# Patient Record
Sex: Male | Born: 1995
Health system: Southern US, Community
[De-identification: ages and names within clinical notes are randomized; demographics above are authoritative.]

## PROBLEM LIST (undated history)

## (undated) ENCOUNTER — Ambulatory Visit (HOSPITAL_COMMUNITY): Admission: EM | Payer: Medicare Other | Source: Home / Self Care

---
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
## 2017-11-02 ENCOUNTER — Encounter (HOSPITAL_COMMUNITY): Payer: Self-pay | Admitting: Emergency Medicine

## 2017-11-02 ENCOUNTER — Emergency Department (HOSPITAL_COMMUNITY)
Admission: EM | Admit: 2017-11-02 | Discharge: 2017-11-02 | Disposition: A | Discharge status: Home / Self Care | Payer: Medicare Other | Attending: Emergency Medicine | Admitting: Emergency Medicine

## 2017-11-02 DIAGNOSIS — Z202 Contact with and (suspected) exposure to infections with a predominantly sexual mode of transmission: Secondary | ICD-10-CM | POA: Insufficient documentation

## 2017-11-02 LAB — RPR: RPR Ser Ql: NONREACTIVE

## 2017-11-02 MED ORDER — CEFTRIAXONE SODIUM 250 MG IJ SOLR
250.0000 mg | Freq: Once | INTRAMUSCULAR | Status: AC
  Administered 2017-11-02: 250 mg via INTRAMUSCULAR
  Filled 2017-11-02: qty 250

## 2017-11-02 MED ORDER — AZITHROMYCIN 1 G PO PACK
1.0000 g | PACK | Freq: Once | ORAL | Status: AC
  Administered 2017-11-02: 1 g via ORAL
  Filled 2017-11-02: qty 1

## 2017-11-02 MED ORDER — STERILE WATER FOR INJECTION IJ SOLN
INTRAMUSCULAR | Status: AC
  Administered 2017-11-02: 0.9 mL
  Filled 2017-11-02: qty 10

## 2017-11-02 NOTE — ED Provider Notes (Signed)
   Emergency Department Provider Note   I have reviewed the triage vital signs and the nursing notes.   HISTORY  Chief Complaint Exposure to STD   HPI Raymond RibasMatthew D Schwartzman is a 22 y.o. male who was exposed to no known sexually transmitted disease.  He is asymptomatic.  No dysuria, discharge, lymph node swelling.  No nausea vomiting or other associated symptoms. No other associated or modifying symptoms.    History reviewed. No pertinent past medical history.  There are no active problems to display for this patient.   History reviewed. No pertinent surgical history.  Current Outpatient Rx  . Order #: [id number] Class: Print    Allergies Patient has no known allergies.  No family history on file.  Social History Social History   Tobacco Use  . Smoking status: Not on file  Substance Use Topics  . Alcohol use: Not on file  . Drug use: Not on file    Review of Systems  All other systems negative except as documented in the HPI. All pertinent positives and negatives as reviewed in the HPI. ____________________________________________   PHYSICAL EXAM:  VITAL SIGNS: ED Triage Vitals  Enc Vitals Group     BP 11/02/17 0553 (!) 145/100     Pulse Rate 11/02/17 0553 72     Resp 11/02/17 0553 12     Temp 11/02/17 0553 97.8 F (36.6 C)     Temp Source 11/02/17 0553 Oral     SpO2 11/02/17 0553 100 %    Constitutional: Alert and oriented. Well appearing and in no acute distress. Eyes: Conjunctivae are normal. PERRL. EOMI. Head: Atraumatic. Nose: No congestion/rhinnorhea. Mouth/Throat: Mucous membranes are moist.  Oropharynx non-erythematous. Neck: No stridor.  No meningeal signs.   Cardiovascular: Normal rate, regular rhythm. Good peripheral circulation. Grossly normal heart sounds.   GU: No inguinal lymphadenopathy.  No evidence of urethritis.  No obvious discharge.  Testicles symmetric. Respiratory: Normal respiratory effort.  No retractions. Lungs  CTAB. Gastrointestinal: Soft and nontender. No distention.  Musculoskeletal: No lower extremity tenderness nor edema. No gross deformities of extremities. Neurologic:  Normal speech and language. No gross focal neurologic deficits are appreciated.  Skin:  Skin is warm, dry and intact. No rash noted.   ____________________________________________   LABS (all labs ordered are listed, but only abnormal results are displayed)  Labs Reviewed  RPR  HIV ANTIBODY (ROUTINE TESTING)  GC/CHLAMYDIA PROBE AMP ([place]) NOT AT [facility]  ____________________________________________   INITIAL IMPRESSION / ASSESSMENT AND PLAN / ED COURSE  STD prophylaxis provided. No other complaints. Informed him that results will return 24-48 hours and would get a call if positive.    Pertinent labs & imaging results that were available during my care of the patient were reviewed by me and considered in my medical decision making (see chart for details).  ____________________________________________  FINAL CLINICAL IMPRESSION(S) / ED DIAGNOSES  Final diagnoses:  STD exposure     MEDICATIONS GIVEN DURING THIS VISIT:  Medications  cefTRIAXone (ROCEPHIN) injection 250 mg (has no administration in time range)  azithromycin (ZITHROMAX) powder 1 g (has no administration in time range)     NEW OUTPATIENT MEDICATIONS STARTED DURING THIS VISIT:  New Prescriptions   No medications on file    Note:  This note was prepared with assistance of Dragon voice recognition software. Occasional wrong-word or sound-a-like substitutions may have occurred due to the inherent limitations of voice recognition software.   Adrien Shankar, Barbara CowerJason, MD 11/02/17 [phone]

## 2017-11-02 NOTE — ED Notes (Signed)
Pt denies any current symptoms other than mild pain but states his "girlfriend may have gave him a STD".

## 2017-11-02 NOTE — ED Triage Notes (Signed)
Pt reports his girlfriend told him she thought he gave her something but pt states he has never had any stds, now concerned someone may have given him something and wants to be checked, denies any symptoms currently

## 2017-11-02 NOTE — ED Notes (Signed)
Pt ambulatory, stable, and verbalizes understanding of d/c instructions. 

## 2017-11-03 LAB — GC/CHLAMYDIA PROBE AMP (~~LOC~~) NOT AT ARMC
Chlamydia: POSITIVE — AB
Neisseria Gonorrhea: POSITIVE — AB

## 2017-11-03 LAB — HIV ANTIBODY (ROUTINE TESTING W REFLEX): HIV Screen 4th Generation wRfx: NONREACTIVE

## 2018-08-03 ENCOUNTER — Encounter (HOSPITAL_COMMUNITY): Payer: Self-pay | Admitting: Emergency Medicine

## 2018-08-03 ENCOUNTER — Other Ambulatory Visit: Payer: Self-pay

## 2018-08-03 ENCOUNTER — Ambulatory Visit (HOSPITAL_COMMUNITY)
Admission: EM | Admit: 2018-08-03 | Discharge: 2018-08-03 | Disposition: A | Discharge status: Home / Self Care | Payer: Medicare Other | Source: Home / Self Care

## 2018-08-03 ENCOUNTER — Emergency Department (HOSPITAL_COMMUNITY)
Admission: EM | Admit: 2018-08-03 | Discharge: 2018-08-03 | Disposition: A | Discharge status: Home / Self Care | Payer: Medicare Other | Attending: Emergency Medicine | Admitting: Emergency Medicine

## 2018-08-03 DIAGNOSIS — Z202 Contact with and (suspected) exposure to infections with a predominantly sexual mode of transmission: Secondary | ICD-10-CM | POA: Insufficient documentation

## 2018-08-03 DIAGNOSIS — F17210 Nicotine dependence, cigarettes, uncomplicated: Secondary | ICD-10-CM | POA: Diagnosis not present

## 2018-08-03 DIAGNOSIS — R369 Urethral discharge, unspecified: Secondary | ICD-10-CM

## 2018-08-03 LAB — URINALYSIS, ROUTINE W REFLEX MICROSCOPIC
BACTERIA UA: NONE SEEN
Bilirubin Urine: NEGATIVE
Glucose, UA: NEGATIVE mg/dL
HGB URINE DIPSTICK: NEGATIVE
KETONES UR: NEGATIVE mg/dL
NITRITE: NEGATIVE
Protein, ur: NEGATIVE mg/dL
Specific Gravity, Urine: 1.025 (ref 1.005–1.030)
pH: 6 (ref 5.0–8.0)

## 2018-08-03 MED ORDER — STERILE WATER FOR INJECTION IJ SOLN
INTRAMUSCULAR | Status: AC
Start: 2018-08-03 — End: 2018-08-03
  Administered 2018-08-03: 10 mL
  Filled 2018-08-03: qty 10

## 2018-08-03 MED ORDER — AZITHROMYCIN 250 MG PO TABS
1000.0000 mg | ORAL_TABLET | Freq: Once | ORAL | Status: AC
  Administered 2018-08-03: 1000 mg via ORAL
  Filled 2018-08-03: qty 4

## 2018-08-03 MED ORDER — CEFTRIAXONE SODIUM 250 MG IJ SOLR
250.0000 mg | Freq: Once | INTRAMUSCULAR | Status: AC
  Administered 2018-08-03: 250 mg via INTRAMUSCULAR
  Filled 2018-08-03: qty 250

## 2018-08-03 NOTE — Discharge Instructions (Signed)
Please read the instructions below.  Talk with your primary care provider about any new medications.  You have been treated today for gonorrhea and chlamydia. You will receive a call from the hospital if your test results come back positive. Avoid sexual activity until you know your test results. If your results come back positive, it is important that you inform all of your sexual partners.  Follow-up with the health department for any concerns or future testing.

## 2018-08-03 NOTE — ED Provider Notes (Signed)
  Physical Exam  BP (!) 149/98 (BP Location: Right Arm)   Pulse 77   Temp 98.1 F (36.7 C) (Oral)   Resp 16   SpO2 100%   Assumed care from [place] Robinson, PA-C at 3:41 PM. Briefly, the patient is a 23 y.o. male with PMHx of  has no past medical history on file. here with penile discharge. Known STI exposure from recent sexual partner, but he does not know what.   Labs Reviewed  HIV ANTIBODY (ROUTINE TESTING W REFLEX)  RPR  URINALYSIS, ROUTINE W REFLEX MICROSCOPIC  GC/CHLAMYDIA PROBE AMP ([place]) NOT AT [facility]    Course of Care:   Physical Exam Vitals signs and nursing note reviewed.  Constitutional:      General: He is not in acute distress.    Appearance: He is well-developed. He is not diaphoretic.     Comments: Sitting comfortably in bed.  HENT:     Head: Normocephalic and atraumatic.  Eyes:     General:        Right eye: No discharge.        Left eye: No discharge.     Conjunctiva/sclera: Conjunctivae normal.     Comments: EOMs normal to gross examination.  Neck:     Musculoskeletal: Normal range of motion.  Cardiovascular:     Rate and Rhythm: Normal rate and regular rhythm.     Comments: Intact, 2+ radial pulse. Abdominal:     General: There is no distension.  Musculoskeletal: Normal range of motion.  Skin:    General: Skin is warm and dry.  Neurological:     Mental Status: He is alert.     Comments: Cranial nerves intact to gross observation. Patient moves extremities without difficulty.  Psychiatric:        Behavior: Behavior normal.        Thought Content: Thought content normal.        Judgment: Judgment normal.     ED Course/Procedures     Procedures  MDM   Urine showing: small leukocytes.  No trichomonas.  Patient was treated with Rocephin and azithromycin.  Instructed not to have sexual intercourse for 7 days.  Recommended follow-up with health department.    Elisha Ponder, PA-C 08/04/18 0042    Donnetta Hutching, MD 08/04/18  [phone]

## 2018-08-03 NOTE — ED Triage Notes (Signed)
Pt to ER for STD evaluation, reports partner told him she went to her doctor and was told she had an STD, he is not sure what. He states he has white pus noted at the tip of his penis "for a weeks."

## 2018-08-03 NOTE — ED Provider Notes (Signed)
[facility] EMERGENCY DEPARTMENT Provider Note   CSN: [id number] Arrival date & time: 08/03/18  1437    History   Chief Complaint Chief Complaint  Patient presents with  . SEXUALLY TRANSMITTED DISEASE    HPI Raymond Reilly is a 23 y.o. male with past medical history of gonorrhea, chlamydia, presenting to the emergency department with complaint of penile discharge.  Patient states his male sexual partner informed him recently that she tested positive for an unknown STD.  States at that time, he noticed "pus" coming from his penis.  He has no associated symptoms, including no dysuria, penile pain, testicular pain or swelling, abdominal pain, fever, pain with defecation.  No interventions tried for symptoms.  He is sexually active with male partners without protection.     The history is provided by the patient.    History reviewed. No pertinent past medical history.  There are no active problems to display for this patient.   History reviewed. No pertinent surgical history.      Home Medications    Prior to Admission medications   Medication Sig Start Date End Date Taking? Authorizing Provider  Olopatadine HCl (PATADAY) 0.2 % SOLN Apply 1 drop to eye every morning. To both eyes x  5 days qs Patient not taking: Reported on 11/02/2017 12/07/12   Marcellina Millin, MD    Family History No family history on file.  Social History Social History   Tobacco Use  . Smoking status: Current Every Day Smoker    Packs/day: 0.25    Types: Cigarettes  . Smokeless tobacco: Never Used  Substance Use Topics  . Alcohol use: Not on file  . Drug use: Not on file     Allergies   Patient has no known allergies.   Review of Systems Review of Systems  Constitutional: Negative for fever.  Gastrointestinal: Negative for abdominal pain.  Genitourinary: Positive for discharge. Negative for dysuria, frequency, penile pain, scrotal swelling and testicular pain.   Skin: Negative for rash.     Physical Exam Updated Vital Signs BP (!) 149/98 (BP Location: Right Arm)   Pulse 77   Temp 98.1 F (36.7 C) (Oral)   Resp 16   SpO2 100%   Physical Exam Vitals signs and nursing note reviewed. Exam conducted with a chaperone present.  Constitutional:      General: He is not in acute distress.    Appearance: He is well-developed.  HENT:     Head: Normocephalic and atraumatic.  Eyes:     Conjunctiva/sclera: Conjunctivae normal.  Cardiovascular:     Rate and Rhythm: Normal rate.  Pulmonary:     Effort: Pulmonary effort is normal.  Abdominal:     General: Bowel sounds are normal.     Tenderness: There is no abdominal tenderness. There is no guarding.  Genitourinary:    Penis: Circumcised. Discharge (White purulent discharge from the urethral meatus) present. No tenderness.      Scrotum/Testes: Normal.        Right: Tenderness or swelling not present. Right testis is descended.        Left: Tenderness or swelling not present. Left testis is descended.     Epididymis:     Right: Normal.     Left: Normal.     Comments: No rashes or lesions Neurological:     Mental Status: He is alert.  Psychiatric:        Mood and Affect: Mood normal.  Behavior: Behavior normal.      ED Treatments / Results  Labs (all labs ordered are listed, but only abnormal results are displayed) Labs Reviewed  HIV ANTIBODY (ROUTINE TESTING W REFLEX)  RPR  URINALYSIS, ROUTINE W REFLEX MICROSCOPIC  GC/CHLAMYDIA PROBE AMP ([place]) NOT AT [facility]    EKG None  Radiology No results found.  Procedures Procedures (including critical care time)  Medications Ordered in ED Medications  cefTRIAXone (ROCEPHIN) injection 250 mg (250 mg Intramuscular Given 08/03/18 1537)  azithromycin (ZITHROMAX) tablet 1,000 mg (1,000 mg Oral Given 08/03/18 1536)  sterile water (preservative free) injection (10 mLs  Given 08/03/18 1537)     Initial Impression / Assessment and  Plan / ED Course  I have reviewed the triage vital signs and the nursing notes.  Pertinent labs & imaging results that were available during my care of the patient were reviewed by me and considered in my medical decision making (see chart for details).        Patient is entering with exposure to STD, and penile discharge.  Patient is otherwise asymptomatic.  Patient is afebrile without abdominal tenderness, abdominal pain or painful bowel movements to indicate prostatitis.  No tenderness to palpation of the testes or epididymis to suggest orchitis or epididymitis.  STD cultures obtained including HIV, syphilis, gonorrhea and chlamydia. Patient has been treated prophylactically with azithromycin and Rocephin. Discussed importance of using protection when sexually active.   Care assumed at shift change by PA [facility], pending UA results.  Patient to be discharged with instructions to follow up with health department.      Final Clinical Impressions(s) / ED Diagnoses   Final diagnoses:  STD exposure  Penile discharge    ED Discharge Orders    None       Cheick Suhr, [place] N, PA-C 08/03/18 1552    Donnetta Hutching, MD 08/03/18 [phone]

## 2018-08-04 LAB — HIV ANTIBODY (ROUTINE TESTING W REFLEX): HIV Screen 4th Generation wRfx: NONREACTIVE

## 2018-08-05 LAB — GC/CHLAMYDIA PROBE AMP (~~LOC~~) NOT AT ARMC
CHLAMYDIA, DNA PROBE: POSITIVE — AB
NEISSERIA GONORRHEA: NEGATIVE

## 2018-08-05 LAB — RPR: RPR: NONREACTIVE

## 2018-09-11 ENCOUNTER — Telehealth: Payer: Self-pay | Admitting: *Deleted

## 2018-09-11 NOTE — Telephone Encounter (Signed)
Medical Assistant left message on patient's home and cell voicemail. Voicemail states to give a call back to [place] with [facility] at [phone]. Patients appointment will be via telephone.

## 2018-09-12 ENCOUNTER — Ambulatory Visit: Payer: Medicare Other | Attending: Primary Care | Admitting: Primary Care

## 2018-09-12 ENCOUNTER — Other Ambulatory Visit: Payer: Self-pay

## 2019-02-19 ENCOUNTER — Other Ambulatory Visit: Payer: Self-pay

## 2019-02-19 ENCOUNTER — Ambulatory Visit (HOSPITAL_COMMUNITY)
Admission: EM | Admit: 2019-02-19 | Discharge: 2019-02-19 | Disposition: A | Discharge status: Home / Self Care | Payer: Medicare Other | Attending: Emergency Medicine | Admitting: Emergency Medicine

## 2019-02-19 ENCOUNTER — Encounter (HOSPITAL_COMMUNITY): Payer: Self-pay | Admitting: Emergency Medicine

## 2019-02-19 DIAGNOSIS — Z0189 Encounter for other specified special examinations: Secondary | ICD-10-CM | POA: Diagnosis not present

## 2019-02-19 DIAGNOSIS — Z113 Encounter for screening for infections with a predominantly sexual mode of transmission: Secondary | ICD-10-CM | POA: Diagnosis not present

## 2019-02-19 NOTE — ED Triage Notes (Signed)
Pt here for STD screening; denies sx  

## 2019-02-19 NOTE — Discharge Instructions (Addendum)
Your STD tests are pending.  If your test results are positive, we will call you.  You may need treatment and your partner may also need treatment.    Do not have sex until your test results are back.   ° °

## 2019-02-19 NOTE — ED Provider Notes (Signed)
[facility]    CSN: [id number] Arrival date & time: 02/19/19  1547      History   Chief Complaint Chief Complaint  Patient presents with  . Exposure to STD    HPI Raymond Reilly is a 23 y.o. male.   Patient presents with request for STD testing, including HIV and RPR.  He states he is not sexually active and does not have symptoms.  He denies penile discharge, testicular pain, abdominal pain, dysuria, back pain, or other concerns.  The history is provided by the patient.    History reviewed. No pertinent past medical history.  There are no active problems to display for this patient.   History reviewed. No pertinent surgical history.     Home Medications    Prior to Admission medications   Medication Sig Start Date End Date Taking? Authorizing Provider  Olopatadine HCl (PATADAY) 0.2 % SOLN Apply 1 drop to eye every morning. To both eyes x  5 days qs Patient not taking: Reported on 11/02/2017 12/07/12   Isaac Bliss, MD    Family History History reviewed. No pertinent family history.  Social History Social History   Tobacco Use  . Smoking status: Current Every Day Smoker    Packs/day: 0.25    Types: Cigarettes  . Smokeless tobacco: Never Used  Substance Use Topics  . Alcohol use: Not on file  . Drug use: Not on file     Allergies   Patient has no known allergies.   Review of Systems Review of Systems  Constitutional: Negative for chills and fever.  HENT: Negative for ear pain and sore throat.   Eyes: Negative for pain and visual disturbance.  Respiratory: Negative for cough and shortness of breath.   Cardiovascular: Negative for chest pain and palpitations.  Gastrointestinal: Negative for abdominal pain and vomiting.  Genitourinary: Negative for discharge, dysuria, flank pain, hematuria and testicular pain.  Musculoskeletal: Negative for arthralgias and back pain.  Skin: Negative for color change and rash.  Neurological: Negative for  seizures and syncope.  All other systems reviewed and are negative.    Physical Exam Triage Vital Signs ED Triage Vitals  Enc Vitals Group     BP 02/19/19 1607 (!) 120/91     Pulse Rate 02/19/19 1607 71     Resp 02/19/19 1607 18     Temp 02/19/19 1607 98.5 F (36.9 C)     Temp Source 02/19/19 1607 Oral     SpO2 02/19/19 1607 99 %     Weight --      Height --      Head Circumference --      Peak Flow --      Pain Score 02/19/19 1608 0     Pain Loc --      Pain Edu? --      Excl. in [place]? --    No data found.  Updated Vital Signs BP (!) 120/91 (BP Location: Right Arm)   Pulse 71   Temp 98.5 F (36.9 C) (Oral)   Resp 18   SpO2 99%   Visual Acuity Right Eye Distance:   Left Eye Distance:   Bilateral Distance:    Right Eye Near:   Left Eye Near:    Bilateral Near:     Physical Exam Vitals signs and nursing note reviewed.  Constitutional:      Appearance: He is well-developed.  HENT:     Head: Normocephalic and atraumatic.  Eyes:  Conjunctiva/sclera: Conjunctivae normal.  Neck:     Musculoskeletal: Neck supple.  Cardiovascular:     Rate and Rhythm: Normal rate and regular rhythm.     Heart sounds: No murmur.  Pulmonary:     Effort: Pulmonary effort is normal. No respiratory distress.     Breath sounds: Normal breath sounds.  Abdominal:     General: Bowel sounds are normal.     Palpations: Abdomen is soft.     Tenderness: There is no abdominal tenderness. There is no right CVA tenderness, left CVA tenderness, guarding or rebound.  Genitourinary:    Penis: Normal.      Scrotum/Testes: Normal.  Skin:    General: Skin is warm and dry.  Neurological:     Mental Status: He is alert.      UC Treatments / Results  Labs (all labs ordered are listed, but only abnormal results are displayed) Labs Reviewed  HIV ANTIBODY (ROUTINE TESTING W REFLEX)  RPR  CYTOLOGY, (ORAL, ANAL, URETHRAL) ANCILLARY ONLY    EKG   Radiology No results found.   Procedures Procedures (including critical care time)  Medications Ordered in UC Medications - No data to display  Initial Impression / Assessment and Plan / UC Course  I have reviewed the triage vital signs and the nursing notes.  Pertinent labs & imaging results that were available during my care of the patient were reviewed by me and considered in my medical decision making (see chart for details).    Patient request for STD testing.  Urethral swab obtained.  HIV and RPR pending.  No treatment given today as patient is asymptomatic and denies sexual activity.  Discussed that he should refrain from sex until the STD tests are back.  Discussed that he may need treatment if his test results are positive.  Patient agrees to plan of care.     Final Clinical Impressions(s) / UC Diagnoses   Final diagnoses:  Patient request for diagnostic testing     Discharge Instructions     Your STD tests are pending.  If your test results are positive, we will call you.  You may need treatment and your partner may also need treatment.      Do not have sex until your test results are back.       ED Prescriptions    None     PDMP not reviewed this encounter.   Mickie Bail, NP 02/19/19 1630

## 2019-02-20 LAB — HIV ANTIBODY (ROUTINE TESTING W REFLEX): HIV Screen 4th Generation wRfx: NONREACTIVE

## 2019-02-20 LAB — RPR: RPR Ser Ql: NONREACTIVE

## 2019-02-21 LAB — CYTOLOGY, (ORAL, ANAL, URETHRAL) ANCILLARY ONLY
Chlamydia: NEGATIVE
Neisseria Gonorrhea: NEGATIVE
Trichomonas: POSITIVE — AB

## 2019-02-22 ENCOUNTER — Telehealth (HOSPITAL_COMMUNITY): Payer: Self-pay | Admitting: Emergency Medicine

## 2019-02-22 MED ORDER — METRONIDAZOLE 500 MG PO TABS: 2000.0000 mg | ORAL_TABLET | Freq: Once | ORAL | 0 refills | Status: AC

## 2019-02-22 NOTE — Telephone Encounter (Signed)
Trichomonas is positive. Rx  for Flagyl 2 grams, once was sent to the pharmacy of record. Pt needs education to refrain from sexual intercourse for 7 days to give the medicine time to work. Sexual partners need to be notified and tested/treated. Condoms may reduce risk of reinfection. Recheck for further evaluation if symptoms are not improving.   Attempted to reach patient. Mother answered and stated she would have him call me back.

## 2019-02-23 ENCOUNTER — Telehealth (HOSPITAL_COMMUNITY): Payer: Self-pay | Admitting: Emergency Medicine

## 2019-02-23 MED ORDER — METRONIDAZOLE 500 MG PO TABS: 2000.0000 mg | ORAL_TABLET | Freq: Once | ORAL | 0 refills | Status: AC

## 2019-02-23 NOTE — Telephone Encounter (Signed)
Called again, mother states to call at 3pm and he will be off work.

## 2019-02-23 NOTE — Telephone Encounter (Signed)
Patient contacted and made aware of trich   results, all questions answered  Pharmacy change

## 2019-03-17 ENCOUNTER — Encounter (HOSPITAL_COMMUNITY): Payer: Self-pay | Admitting: Emergency Medicine

## 2019-03-17 ENCOUNTER — Emergency Department (HOSPITAL_COMMUNITY)
Admission: EM | Admit: 2019-03-17 | Discharge: 2019-03-17 | Disposition: A | Discharge status: Home / Self Care | Payer: Medicare Other | Attending: Emergency Medicine | Admitting: Emergency Medicine

## 2019-03-17 ENCOUNTER — Emergency Department (HOSPITAL_COMMUNITY): Payer: Medicare Other

## 2019-03-17 DIAGNOSIS — Y9389 Activity, other specified: Secondary | ICD-10-CM | POA: Insufficient documentation

## 2019-03-17 DIAGNOSIS — S6991XA Unspecified injury of right wrist, hand and finger(s), initial encounter: Secondary | ICD-10-CM

## 2019-03-17 DIAGNOSIS — W25XXXA Contact with sharp glass, initial encounter: Secondary | ICD-10-CM | POA: Diagnosis not present

## 2019-03-17 DIAGNOSIS — F17210 Nicotine dependence, cigarettes, uncomplicated: Secondary | ICD-10-CM | POA: Diagnosis not present

## 2019-03-17 DIAGNOSIS — Y92003 Bedroom of unspecified non-institutional (private) residence as the place of occurrence of the external cause: Secondary | ICD-10-CM | POA: Diagnosis not present

## 2019-03-17 DIAGNOSIS — Y999 Unspecified external cause status: Secondary | ICD-10-CM | POA: Diagnosis not present

## 2019-03-17 DIAGNOSIS — S61421A Laceration with foreign body of right hand, initial encounter: Secondary | ICD-10-CM | POA: Insufficient documentation

## 2019-03-17 MED ORDER — HYDROCODONE-ACETAMINOPHEN 5-325 MG PO TABS
1.0000 | ORAL_TABLET | Freq: Once | ORAL | Status: DC
  Filled 2019-03-17: qty 1

## 2019-03-17 MED ORDER — IBUPROFEN 600 MG PO TABS: 600.0000 mg | ORAL_TABLET | Freq: Four times a day (QID) | ORAL | 0 refills | Status: DC | PRN

## 2019-03-17 MED ORDER — ACETAMINOPHEN 500 MG PO TABS: 500.0000 mg | ORAL_TABLET | Freq: Four times a day (QID) | ORAL | 0 refills | Status: DC | PRN

## 2019-03-17 MED ORDER — CEPHALEXIN 500 MG PO CAPS
500.0000 mg | ORAL_CAPSULE | Freq: Three times a day (TID) | ORAL | 0 refills | Status: AC
Start: 2019-03-17 — End: 2019-03-22

## 2019-03-17 NOTE — Discharge Instructions (Signed)
Keep the area clean and dry.  The Steri-Strips will fall off within about 4 to 5 days.  Apply antibiotic ointment after the Steri-Strips fall off but not before.  Please take all of your antibiotics until finished!   You may develop abdominal discomfort or diarrhea from the antibiotic.  You may help offset this with probiotics which you can buy or get in yogurt. Do not eat  or take the probiotics until 2 hours after your antibiotic.   You can take 1 to 2 tablets of Tylenol (350mg -1000mg  depending on the dose) every 6 hours as needed for pain.  Do not exceed 4000 mg of Tylenol daily.  If your pain persists you can take a doses of ibuprofen in between doses of Tylenol.  I usually recommend 400 to 600 mg of ibuprofen every 6 hours.  Take this with food to avoid upset stomach issues.  You can also apply ice and elevate the hand above the level of the heart to help reduce swelling and pain.  Return to the emergency department immediately if any concerning signs or symptoms develop such as fevers, severe swelling, redness of the skin or streaking of redness up the arm, or abnormal drainage.  Follow-up with primary care provider for wound recheck if you have any ongoing issues.
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
## 2019-03-17 NOTE — ED Notes (Signed)
Patient transported to X-ray 
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
## 2019-03-17 NOTE — ED Notes (Signed)
Pt. Refused his pain medication

## 2020-04-26 ENCOUNTER — Other Ambulatory Visit: Payer: Self-pay

## 2020-04-26 ENCOUNTER — Encounter (HOSPITAL_COMMUNITY): Payer: Self-pay | Admitting: Emergency Medicine

## 2020-04-26 ENCOUNTER — Emergency Department (HOSPITAL_COMMUNITY): Payer: Medicare Other

## 2020-04-26 ENCOUNTER — Emergency Department (HOSPITAL_COMMUNITY)
Admission: EM | Admit: 2020-04-26 | Discharge: 2020-04-26 | Disposition: A | Discharge status: Home / Self Care | Payer: Medicare Other | Attending: Emergency Medicine | Admitting: Emergency Medicine

## 2020-04-26 DIAGNOSIS — Z5321 Procedure and treatment not carried out due to patient leaving prior to being seen by health care provider: Secondary | ICD-10-CM | POA: Diagnosis not present

## 2020-04-26 DIAGNOSIS — S0083XA Contusion of other part of head, initial encounter: Secondary | ICD-10-CM | POA: Diagnosis not present

## 2020-04-26 LAB — CBC WITH DIFFERENTIAL/PLATELET
Abs Immature Granulocytes: 0.18 10*3/uL — ABNORMAL HIGH (ref 0.00–0.07)
Basophils Absolute: 0.1 10*3/uL (ref 0.0–0.1)
Basophils Relative: 0 %
Eosinophils Absolute: 0 10*3/uL (ref 0.0–0.5)
Eosinophils Relative: 0 %
HCT: 45.9 % (ref 39.0–52.0)
Hemoglobin: 15.3 g/dL (ref 13.0–17.0)
Immature Granulocytes: 1 %
Lymphocytes Relative: 5 %
Lymphs Abs: 0.9 10*3/uL (ref 0.7–4.0)
MCH: 30.2 pg (ref 26.0–34.0)
MCHC: 33.3 g/dL (ref 30.0–36.0)
MCV: 90.7 fL (ref 80.0–100.0)
Monocytes Absolute: 1 10*3/uL (ref 0.1–1.0)
Monocytes Relative: 5 %
Neutro Abs: 18.2 10*3/uL — ABNORMAL HIGH (ref 1.7–7.7)
Neutrophils Relative %: 89 %
Platelets: 202 10*3/uL (ref 150–400)
RBC: 5.06 MIL/uL (ref 4.22–5.81)
RDW: 12.5 % (ref 11.5–15.5)
WBC: 20.3 10*3/uL — ABNORMAL HIGH (ref 4.0–10.5)
nRBC: 0 % (ref 0.0–0.2)

## 2020-04-26 LAB — BASIC METABOLIC PANEL
Anion gap: 13 (ref 5–15)
BUN: 12 mg/dL (ref 6–20)
CO2: 24 mmol/L (ref 22–32)
Calcium: 9.4 mg/dL (ref 8.9–10.3)
Chloride: 103 mmol/L (ref 98–111)
Creatinine, Ser: 1.24 mg/dL (ref 0.61–1.24)
GFR, Estimated: >60 mL/min (ref >60)
Glucose, Bld: 109 mg/dL — ABNORMAL HIGH (ref 70–99)
Potassium: 3.6 mmol/L (ref 3.5–5.1)
Sodium: 140 mmol/L (ref 135–145)

## 2020-04-26 MED ORDER — OXYCODONE-ACETAMINOPHEN 5-325 MG PO TABS
1.0000 | ORAL_TABLET | Freq: Once | ORAL | Status: AC
  Administered 2020-04-26: 1 via ORAL
  Filled 2020-04-26: qty 1
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
## 2020-06-30 ENCOUNTER — Encounter (HOSPITAL_COMMUNITY): Payer: Self-pay | Admitting: *Deleted

## 2020-06-30 ENCOUNTER — Other Ambulatory Visit: Payer: Self-pay

## 2020-06-30 ENCOUNTER — Ambulatory Visit (HOSPITAL_COMMUNITY)
Admission: EM | Admit: 2020-06-30 | Discharge: 2020-06-30 | Disposition: A | Discharge status: Home / Self Care | Payer: Medicare Other | Attending: Urgent Care | Admitting: Urgent Care

## 2020-06-30 DIAGNOSIS — R369 Urethral discharge, unspecified: Secondary | ICD-10-CM

## 2020-06-30 NOTE — ED Triage Notes (Signed)
Pt reports STD with penial  Discharge.

## 2020-06-30 NOTE — ED Provider Notes (Signed)
  [facility]   MRN: [id number] DOB: [birth year]  Subjective:   Raymond Reilly is a 25 y.o. male presenting for 3-day history of penile discharge.  Patient states that he wants to make sure he gets checked for sexually transmitted infections.  He has multiple male partners and tries to use condoms consistently but is not as consistent when he receives oral sex. Denies dysuria, hematuria, urinary frequency, penile swelling, testicular pain, testicular swelling, anal pain, groin pain.  No exposures to his knowledge.   No current facility-administered medications for this encounter.  Current Outpatient Medications:  .  acetaminophen (TYLENOL) 500 MG tablet, Take 1 tablet (500 mg total) by mouth every 6 (six) hours as needed., Disp: 30 tablet, Rfl: 0 .  ibuprofen (ADVIL) 600 MG tablet, Take 1 tablet (600 mg total) by mouth every 6 (six) hours as needed., Disp: 30 tablet, Rfl: 0   No Known Allergies  History reviewed. No pertinent past medical history.   History reviewed. No pertinent surgical history.  History reviewed. No pertinent family history.  Social History   Tobacco Use  . Smoking status: Current Every Day Smoker    Packs/day: 0.25    Types: Cigarettes  . Smokeless tobacco: Never Used  Substance Use Topics  . Alcohol use: Yes  . Drug use: Never    ROS   Objective:   Vitals: BP 138/87 (BP Location: Right Arm)   Pulse 63   Temp 98.3 F (36.8 C) (Oral)   Resp 16   SpO2 100%   Physical Exam Constitutional:      General: He is not in acute distress.    Appearance: Normal appearance. He is well-developed and normal weight. He is not ill-appearing, toxic-appearing or diaphoretic.  HENT:     Head: Normocephalic and atraumatic.     Right Ear: External ear normal.     Left Ear: External ear normal.     Nose: Nose normal.     Mouth/Throat:     Pharynx: Oropharynx is clear.  Eyes:     General: No scleral icterus.       Right eye: No  discharge.        Left eye: No discharge.     Extraocular Movements: Extraocular movements intact.     Pupils: Pupils are equal, round, and reactive to light.  Cardiovascular:     Rate and Rhythm: Normal rate.  Pulmonary:     Effort: Pulmonary effort is normal.  Genitourinary:    Penis: Circumcised. No phimosis, paraphimosis, hypospadias, erythema, tenderness, discharge, swelling or lesions.   Musculoskeletal:     Cervical back: Normal range of motion.  Neurological:     Mental Status: He is alert and oriented to person, place, and time.  Psychiatric:        Mood and Affect: Mood normal.        Behavior: Behavior normal.        Thought Content: Thought content normal.        Judgment: Judgment normal.      Assessment and Plan :   PDMP not reviewed this encounter.  1. Penile discharge     Recommended he continue safe sex practices.  Hold off on having any sex until we see lab results.  We will treat as appropriate.   Wallis Bamberg, PA-C 06/30/20 1125

## 2020-07-01 ENCOUNTER — Telehealth (HOSPITAL_COMMUNITY): Payer: Self-pay | Admitting: Emergency Medicine

## 2020-07-01 LAB — CYTOLOGY, (ORAL, ANAL, URETHRAL) ANCILLARY ONLY
Chlamydia: POSITIVE — AB
Comment: NEGATIVE
Comment: NEGATIVE
Comment: NORMAL
Neisseria Gonorrhea: NEGATIVE
Trichomonas: NEGATIVE

## 2020-07-01 MED ORDER — DOXYCYCLINE HYCLATE 100 MG PO CAPS
100.0000 mg | ORAL_CAPSULE | Freq: Two times a day (BID) | ORAL | 0 refills | Status: AC
Start: 2020-07-01 — End: 2020-07-08

## 2020-07-02 ENCOUNTER — Telehealth (HOSPITAL_COMMUNITY): Payer: Self-pay | Admitting: Emergency Medicine

## 2020-07-02 NOTE — Telephone Encounter (Signed)
Patient called and states the pharmacy did not receive his prescription.  I note the receipt is confirmed in the computer, so I called pharmacy to follow up.  Pharmacy states prescription was picked up yesterday.   Patient made aware he will need to contact the pharmacy for discrepancies.

## 2020-08-17 ENCOUNTER — Encounter (HOSPITAL_COMMUNITY): Payer: Self-pay

## 2020-08-17 ENCOUNTER — Other Ambulatory Visit: Payer: Self-pay

## 2020-08-17 ENCOUNTER — Ambulatory Visit (HOSPITAL_COMMUNITY)
Admission: EM | Admit: 2020-08-17 | Discharge: 2020-08-17 | Disposition: A | Discharge status: Home / Self Care | Payer: Medicare Other | Attending: Emergency Medicine | Admitting: Emergency Medicine

## 2020-08-17 DIAGNOSIS — R369 Urethral discharge, unspecified: Secondary | ICD-10-CM | POA: Insufficient documentation

## 2020-08-17 DIAGNOSIS — Z113 Encounter for screening for infections with a predominantly sexual mode of transmission: Secondary | ICD-10-CM | POA: Diagnosis not present

## 2020-08-17 NOTE — Discharge Instructions (Addendum)
We will contact you with the results from your lab work and any additional treatment.    Do not have sex while taking undergoing treatment for STI.  Make sure that all of your partners get tested and treated.   Use a condom or other barrier method for all sexual encounters.    Return or go to the Emergency Department if symptoms worsen or do not improve in the next few days.   

## 2020-08-17 NOTE — ED Triage Notes (Signed)
Pt present penile discharge, pt states yesterday he was drain pus.

## 2020-08-17 NOTE — ED Provider Notes (Signed)
[facility]    CSN: [id number] Arrival date & time: 08/17/20  1040      History   Chief Complaint Chief Complaint  Patient presents with  . Exposure to STD    HPI Raymond Reilly is a 25 y.o. male.   Patient here for evaluation of penile discharge that started yesterday.  Denies any known exposures STIs but patient is sexually active.  Denies any pain or swelling to the penis or scrotum.  Denies any dysuria, urgency, or frequency.  Has not tried any OTC medications or treatments. Denies any specific alleviating or aggravating factors.  Denies any fevers, chest pain, shortness of breath, N/V/D, numbness, tingling, weakness, abdominal pain, or headaches.   ROS: As per HPI, all other pertinent ROS negative   The history is provided by the patient.  Exposure to STD    History reviewed. No pertinent past medical history.  There are no problems to display for this patient.   History reviewed. No pertinent surgical history.     Home Medications    Prior to Admission medications   Medication Sig Start Date End Date Taking? Authorizing Provider  acetaminophen (TYLENOL) 500 MG tablet Take 1 tablet (500 mg total) by mouth every 6 (six) hours as needed. 03/17/19   Fawze, Mina A, PA-C  ibuprofen (ADVIL) 600 MG tablet Take 1 tablet (600 mg total) by mouth every 6 (six) hours as needed. 03/17/19   Jeanie Sewer, PA-C    Family History History reviewed. No pertinent family history.  Social History Social History   Tobacco Use  . Smoking status: Current Every Day Smoker    Packs/day: 0.25    Types: Cigarettes  . Smokeless tobacco: Never Used  Substance Use Topics  . Alcohol use: Yes  . Drug use: Never     Allergies   Patient has no known allergies.   Review of Systems Review of Systems  Genitourinary: Positive for penile discharge.  All other systems reviewed and are negative.    Physical Exam Triage Vital Signs ED Triage Vitals  Enc Vitals Group      BP 08/17/20 1119 131/84     Pulse Rate 08/17/20 1119 62     Resp 08/17/20 1119 18     Temp 08/17/20 1119 98.9 F (37.2 C)     Temp Source 08/17/20 1119 Oral     SpO2 08/17/20 1119 100 %     Weight --      Height --      Head Circumference --      Peak Flow --      Pain Score 08/17/20 1118 1     Pain Loc --      Pain Edu? --      Excl. in GC? --    No data found.  Updated Vital Signs BP 131/84 (BP Location: Left Arm)   Pulse 62   Temp 98.9 F (37.2 C) (Oral)   Resp 18   SpO2 100%   Visual Acuity Right Eye Distance:   Left Eye Distance:   Bilateral Distance:    Right Eye Near:   Left Eye Near:    Bilateral Near:     Physical Exam Vitals and nursing note reviewed.  Constitutional:      General: He is not in acute distress.    Appearance: Normal appearance. He is not ill-appearing, toxic-appearing or diaphoretic.  HENT:     Head: Normocephalic and atraumatic.  Eyes:     Conjunctiva/sclera:  Conjunctivae normal.  Cardiovascular:     Rate and Rhythm: Normal rate.     Pulses: Normal pulses.  Pulmonary:     Effort: Pulmonary effort is normal.  Abdominal:     General: Abdomen is flat.  Genitourinary:    Comments: declines Musculoskeletal:        General: Normal range of motion.     Cervical back: Normal range of motion.  Skin:    General: Skin is warm and dry.  Neurological:     General: No focal deficit present.     Mental Status: He is alert and oriented to person, place, and time.  Psychiatric:        Mood and Affect: Mood normal.      UC Treatments / Results  Labs (all labs ordered are listed, but only abnormal results are displayed) Labs Reviewed  CYTOLOGY, (ORAL, ANAL, URETHRAL) ANCILLARY ONLY    EKG   Radiology No results found.  Procedures Procedures (including critical care time)  Medications Ordered in UC Medications - No data to display  Initial Impression / Assessment and Plan / UC Course  I have reviewed the triage vital  signs and the nursing notes.  Pertinent labs & imaging results that were available during my care of the patient were reviewed by me and considered in my medical decision making (see chart for details).     Penile discharge, STI screening Self swab obtained.  Will treat based on results. Safe sex practices discussed including condoms or other barrier methods. Follow-up for any with worsening symptoms  Final Clinical Impressions(s) / UC Diagnoses   Final diagnoses:  Penile discharge  Screen for STD (sexually transmitted disease)     Discharge Instructions     We will contact you with the results from your lab work and any additional treatment.    Do not have sex while taking undergoing treatment for STI.  Make sure that all of your partners get tested and treated.   Use a condom or other barrier method for all sexual encounters.    Return or go to the Emergency Department if symptoms worsen or do not improve in the next few days.      ED Prescriptions    None     PDMP not reviewed this encounter.   Ivette Loyal, NP 08/17/20 1209

## 2020-08-18 ENCOUNTER — Telehealth (HOSPITAL_COMMUNITY): Payer: Self-pay | Admitting: Emergency Medicine

## 2020-08-18 LAB — CYTOLOGY, (ORAL, ANAL, URETHRAL) ANCILLARY ONLY
Chlamydia: NEGATIVE
Comment: NEGATIVE
Comment: NEGATIVE
Comment: NORMAL
Neisseria Gonorrhea: NEGATIVE
Trichomonas: POSITIVE — AB

## 2020-08-18 MED ORDER — METRONIDAZOLE 500 MG PO TABS: 500.0000 mg | ORAL_TABLET | Freq: Two times a day (BID) | ORAL | 0 refills | Status: DC

## 2020-08-18 NOTE — Telephone Encounter (Signed)
Patient wanted prescription resent to different pharmacy

## 2020-09-10 ENCOUNTER — Ambulatory Visit (HOSPITAL_COMMUNITY)
Admission: EM | Admit: 2020-09-10 | Discharge: 2020-09-10 | Disposition: A | Discharge status: Home / Self Care | Payer: Medicare Other | Attending: Medical Oncology | Admitting: Medical Oncology

## 2020-09-10 ENCOUNTER — Encounter (HOSPITAL_COMMUNITY): Payer: Self-pay

## 2020-09-10 DIAGNOSIS — R369 Urethral discharge, unspecified: Secondary | ICD-10-CM | POA: Diagnosis present

## 2020-09-10 LAB — HIV ANTIBODY (ROUTINE TESTING W REFLEX): HIV Screen 4th Generation wRfx: NONREACTIVE

## 2020-09-10 NOTE — ED Triage Notes (Signed)
Pt requested STD's testing.

## 2020-09-10 NOTE — ED Provider Notes (Signed)
[facility]    CSN: [id number] Arrival date & time: 09/10/20  8032      History   Chief Complaint STI screening  HPI Raymond Reilly is a 24 y.o. male.   HPI   STI screening: Pt presents today for STI screening as he has had penile discharge. He has a history of trichomonas, chlmaydia and gonorrhea with last result being on 08/17/2020 with trichomonas which was treated with flagyl. Pt reports that his symptoms resolved but have returned. Has had potential re-exposure. NO fevers, abdominal pain, testicular pain, penile lesions, dysuria.  History reviewed. No pertinent past medical history.  There are no problems to display for this patient.   History reviewed. No pertinent surgical history.   Home Medications    Prior to Admission medications   Medication Sig Start Date End Date Taking? Authorizing Provider  acetaminophen (TYLENOL) 500 MG tablet Take 1 tablet (500 mg total) by mouth every 6 (six) hours as needed. 03/17/19   Fawze, Mina A, PA-C  ibuprofen (ADVIL) 600 MG tablet Take 1 tablet (600 mg total) by mouth every 6 (six) hours as needed. 03/17/19   Fawze, Mina A, PA-C  metroNIDAZOLE (FLAGYL) 500 MG tablet Take 1 tablet (500 mg total) by mouth 2 (two) times daily. 08/18/20   Lamptey, Britta Mccreedy, MD    Family History History reviewed. No pertinent family history.  Social History Social History   Tobacco Use  . Smoking status: Current Every Day Smoker    Packs/day: 0.25    Types: Cigarettes  . Smokeless tobacco: Never Used  Substance Use Topics  . Alcohol use: Yes  . Drug use: Never     Allergies   Patient has no known allergies.   Review of Systems Review of Systems  As stated above in HPI Physical Exam Triage Vital Signs ED Triage Vitals  Enc Vitals Group     BP 09/10/20 1047 (!) 145/87     Pulse Rate 09/10/20 1047 60     Resp 09/10/20 1047 16     Temp 09/10/20 1047 98.4 F (36.9 C)     Temp Source 09/10/20 1047 Oral     SpO2  09/10/20 1047 98 %     Weight --      Height --      Head Circumference --      Peak Flow --      Pain Score 09/10/20 1046 0     Pain Loc --      Pain Edu? --      Excl. in [place]? --    No data found.  Updated Vital Signs BP (!) 145/87 (BP Location: Right Arm)   Pulse 60   Temp 98.4 F (36.9 C) (Oral)   Resp 16   SpO2 98%   Physical Exam Vitals and nursing note reviewed.  Constitutional:      General: He is not in acute distress.    Appearance: Normal appearance. He is not ill-appearing, toxic-appearing or diaphoretic.  Cardiovascular:     Rate and Rhythm: Normal rate and regular rhythm.     Pulses: Normal pulses.     Heart sounds: Normal heart sounds.  Pulmonary:     Effort: Pulmonary effort is normal.     Breath sounds: Normal breath sounds.  Abdominal:     General: Bowel sounds are normal. There is no distension.     Palpations: Abdomen is soft. There is no mass.     Tenderness: There is no  abdominal tenderness. There is no right CVA tenderness, left CVA tenderness, guarding or rebound.     Hernia: No hernia is present.  Genitourinary:    Comments: Pt performs self swab collection in privacy  Lymphadenopathy:     Cervical: No cervical adenopathy.  Skin:    General: Skin is warm.     Coloration: Skin is not jaundiced.  Neurological:     Mental Status: He is alert.      UC Treatments / Results  Labs (all labs ordered are listed, but only abnormal results are displayed) Labs Reviewed - No data to display  EKG   Radiology No results found.  Procedures Procedures (including critical care time)  Medications Ordered in UC Medications - No data to display  Initial Impression / Assessment and Plan / UC Course  I have reviewed the triage vital signs and the nursing notes.  Pertinent labs & imaging results that were available during my care of the patient were reviewed by me and considered in my medical decision making (see chart for details).     New.   We will rescreen and treat as appropriate.  Patient wants to discuss long-term complications of STIs which we did- he is concerned that he may become "sterile" from repeat STIs.  He also wishes to see a fertility specialist to ensure that his repeated STIs will not affect his ability to have children.  Final Clinical Impressions(s) / UC Diagnoses   Final diagnoses:  Penile discharge   Discharge Instructions   None    ED Prescriptions    None     PDMP not reviewed this encounter.   Rushie Chestnut, [place] 09/10/20 1145

## 2020-09-11 LAB — CYTOLOGY, (ORAL, ANAL, URETHRAL) ANCILLARY ONLY
Chlamydia: POSITIVE — AB
Comment: NEGATIVE
Comment: NEGATIVE
Comment: NORMAL
Neisseria Gonorrhea: NEGATIVE
Trichomonas: POSITIVE — AB

## 2020-09-11 LAB — RPR: RPR Ser Ql: NONREACTIVE

## 2020-09-12 ENCOUNTER — Telehealth: Payer: Self-pay

## 2020-09-12 ENCOUNTER — Telehealth (HOSPITAL_COMMUNITY): Payer: Self-pay | Admitting: Emergency Medicine

## 2020-09-12 MED ORDER — METRONIDAZOLE 500 MG PO TABS: 2000.0000 mg | ORAL_TABLET | Freq: Once | ORAL | 0 refills | Status: AC

## 2020-09-12 MED ORDER — AZITHROMYCIN 250 MG PO TABS: 1000.0000 mg | ORAL_TABLET | Freq: Once | ORAL | 0 refills | Status: AC

## 2020-09-12 NOTE — Telephone Encounter (Signed)
Pt contacted office in regards to RXs sent to pharmacy today. Tried contacting pt back, no answer, no voicemail.

## 2020-09-25 NOTE — Telephone Encounter (Signed)
Patient returned call after receiving letter.  States he picked up and took the medicines prescribed but is still having discharge when he squeezes his penis.  Encouraged him to return for a visit.  Patient verbalized understanding and appointment scheduled for Monday.

## 2020-09-29 ENCOUNTER — Ambulatory Visit (HOSPITAL_COMMUNITY): Payer: Medicare Other

## 2021-12-18 ENCOUNTER — Other Ambulatory Visit: Payer: Self-pay | Admitting: *Deleted

## 2021-12-18 NOTE — Patient Outreach (Signed)
  Care Coordination   12/18/2021 Name: Raymond Reilly MRN: [id number] DOB: 1995/11/18   Care Coordination Outreach Attempts:  An unsuccessful telephone outreach was attempted today to offer the patient information about available care coordination services as a benefit of their health plan.   Follow Up Plan:  No further outreach attempts will be made at this time. We have been unable to contact the patient to offer or enroll patient in care coordination services. Phone is no longer in service. Mother's phone does not accept calls. No info in Epic after 08/2020. No records in [place] or [place].  Encounter Outcome:  No Answer  Care Coordination Interventions Activated:  No   Care Coordination Interventions:  No, not indicated    Facundo Allemand C. Burgess Estelle, MSN, [facility] Gerontological Nurse Practitioner [facility] Care Management [phone]

## 2022-01-21 ENCOUNTER — Ambulatory Visit (HOSPITAL_COMMUNITY): Payer: Medicare Other

## 2022-01-24 ENCOUNTER — Ambulatory Visit (HOSPITAL_COMMUNITY): Payer: Medicare Other
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
## 2022-02-13 ENCOUNTER — Ambulatory Visit (HOSPITAL_COMMUNITY)
Admission: EM | Admit: 2022-02-13 | Discharge: 2022-02-13 | Disposition: A | Discharge status: Home / Self Care | Payer: Medicare Other | Attending: Physician Assistant | Admitting: Physician Assistant

## 2022-02-13 ENCOUNTER — Encounter (HOSPITAL_COMMUNITY): Payer: Self-pay

## 2022-02-13 DIAGNOSIS — Z202 Contact with and (suspected) exposure to infections with a predominantly sexual mode of transmission: Secondary | ICD-10-CM | POA: Diagnosis present

## 2022-02-13 LAB — RAPID HIV SCREEN (HIV 1/2 AB+AG)
HIV 1/2 Antibodies: NONREACTIVE
HIV-1 P24 Antigen - HIV24: NONREACTIVE

## 2022-02-13 LAB — HIV ANTIBODY (ROUTINE TESTING W REFLEX): HIV Screen 4th Generation wRfx: NONREACTIVE

## 2022-02-13 NOTE — ED Triage Notes (Signed)
Patient with c/o itching in genital area. States he wants to be tested for all STIs.

## 2022-02-13 NOTE — ED Provider Notes (Signed)
[facility]    CSN: [id number] Arrival date & time: 02/13/22  1102      History   Chief Complaint Chief Complaint  Patient presents with   Exposure to STD    HPI Raymond Reilly is a 26 y.o. male.   Pt is requesting std testing.  Pt worried he has had an exposure.  Pt denies any current symptoms   The history is provided by the patient. No language interpreter was used.  Exposure to STD This is a new problem. The problem occurs constantly. Pertinent negatives include no abdominal pain. Nothing aggravates the symptoms. Nothing relieves the symptoms. He has tried nothing for the symptoms. The treatment provided no relief.    History reviewed. No pertinent past medical history.  There are no problems to display for this patient.   History reviewed. No pertinent surgical history.     Home Medications    Prior to Admission medications   Not on File    Family History No family history on file.  Social History Social History   Tobacco Use   Smoking status: Every Day    Packs/day: 0.25    Types: Cigarettes   Smokeless tobacco: Never  Substance Use Topics   Alcohol use: Yes   Drug use: Never     Allergies   Patient has no known allergies.   Review of Systems Review of Systems  Gastrointestinal:  Negative for abdominal pain.  All other systems reviewed and are negative.    Physical Exam Triage Vital Signs ED Triage Vitals [02/13/22 1218]  Enc Vitals Group     BP 125/83     Pulse Rate (!) 59     Resp 20     Temp 98.3 F (36.8 C)     Temp Source Oral     SpO2      Weight      Height      Head Circumference      Peak Flow      Pain Score      Pain Loc      Pain Edu?      Excl. in [place]?    No data found.  Updated Vital Signs BP 125/83 (BP Location: Right Arm)   Pulse (!) 59   Temp 98.3 F (36.8 C) (Oral)   Resp 20   Visual Acuity Right Eye Distance:   Left Eye Distance:   Bilateral Distance:    Right Eye Near:   Left  Eye Near:    Bilateral Near:     Physical Exam Vitals and nursing note reviewed.  Constitutional:      Appearance: He is well-developed.  HENT:     Head: Normocephalic.  Cardiovascular:     Rate and Rhythm: Normal rate.  Pulmonary:     Effort: Pulmonary effort is normal.  Abdominal:     General: There is no distension.  Musculoskeletal:        General: Normal range of motion.     Cervical back: Normal range of motion.  Skin:    General: Skin is warm.  Neurological:     General: No focal deficit present.     Mental Status: He is alert and oriented to person, place, and time.      UC Treatments / Results  Labs (all labs ordered are listed, but only abnormal results are displayed) Labs Reviewed  HIV ANTIBODY (ROUTINE TESTING W REFLEX)  RAPID HIV SCREEN (HIV 1/2 AB+AG)  CYTOLOGY, (ORAL,  ANAL, URETHRAL) ANCILLARY ONLY    EKG   Radiology No results found.  Procedures Procedures (including critical care time)  Medications Ordered in UC Medications - No data to display  Initial Impression / Assessment and Plan / UC Course  I have reviewed the triage vital signs and the nursing notes.  Pertinent labs & imaging results that were available during my care of the patient were reviewed by me and considered in my medical decision making (see chart for details).    STD test pending Final Clinical Impressions(s) / UC Diagnoses   Final diagnoses:  Possible exposure to STD     Discharge Instructions      Your STD test are pending   ED Prescriptions   None    PDMP not reviewed this encounter. An After Visit Summary was printed and given to the patient.    Elson Areas, [place] 02/13/22 1252

## 2022-02-13 NOTE — Discharge Instructions (Addendum)
Your STD test are pending

## 2022-02-15 ENCOUNTER — Ambulatory Visit (HOSPITAL_COMMUNITY)
Admission: EM | Admit: 2022-02-15 | Discharge: 2022-02-15 | Disposition: A | Discharge status: Home / Self Care | Payer: Medicare Other | Attending: Internal Medicine | Admitting: Internal Medicine

## 2022-02-15 DIAGNOSIS — Z113 Encounter for screening for infections with a predominantly sexual mode of transmission: Secondary | ICD-10-CM | POA: Diagnosis present

## 2022-02-15 LAB — CYTOLOGY, (ORAL, ANAL, URETHRAL) ANCILLARY ONLY
Chlamydia: NEGATIVE
Comment: NEGATIVE
Comment: NEGATIVE
Comment: NORMAL
Neisseria Gonorrhea: NEGATIVE
Trichomonas: NEGATIVE
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
## 2022-02-16 LAB — RPR: RPR Ser Ql: NONREACTIVE

## 2022-04-17 ENCOUNTER — Emergency Department (HOSPITAL_COMMUNITY)
Admission: EM | Admit: 2022-04-17 | Discharge: 2022-04-17 | Discharge status: Against Medical Advice | Payer: Medicare Other | Source: Home / Self Care

## 2022-10-11 ENCOUNTER — Other Ambulatory Visit: Payer: Self-pay

## 2022-10-11 ENCOUNTER — Ambulatory Visit (HOSPITAL_COMMUNITY)
Admission: EM | Admit: 2022-10-11 | Discharge: 2022-10-11 | Disposition: A | Discharge status: Home / Self Care | Payer: 59 | Attending: Emergency Medicine | Admitting: Emergency Medicine

## 2022-10-11 ENCOUNTER — Encounter (HOSPITAL_COMMUNITY): Payer: Self-pay

## 2022-10-11 VITALS — BP 120/82 | HR 80 | Temp 98.2°F | Resp 18 | Ht 72.0 in | Wt 277.8 lb

## 2022-10-11 DIAGNOSIS — Z113 Encounter for screening for infections with a predominantly sexual mode of transmission: Secondary | ICD-10-CM | POA: Insufficient documentation

## 2022-10-11 NOTE — Discharge Instructions (Addendum)
We will call you if anything on your swab returns positive. Please abstain from sexual intercourse until your results return. 

## 2022-10-11 NOTE — ED Triage Notes (Signed)
Pt requesting to be check for STD's, states he is having some secretion coming out.

## 2022-10-11 NOTE — ED Provider Notes (Signed)
[facility]    CSN: [id number] Arrival date & time: 10/11/22  1644      History   Chief Complaint Chief Complaint  Patient presents with   Exposure to STD    Pt requesting to be check for STD's, states he is having some secretion coming out.    HPI Raymond Reilly is a 27 y.o. male.  Here for STD testing Reports unprotected intercourse with more than one partner. Denies any exposure. Intermittent penile discharge for a few days. No dysuria, swelling or pain of testicles, rash or lesions  History reviewed. No pertinent past medical history.  There are no problems to display for this patient.   History reviewed. No pertinent surgical history.     Home Medications    Prior to Admission medications   Not on File    Family History History reviewed. No pertinent family history.  Social History Social History   Tobacco Use   Smoking status: Every Day    Packs/day: .25    Types: Cigarettes   Smokeless tobacco: Never  Substance Use Topics   Alcohol use: Yes   Drug use: Never     Allergies   Patient has no known allergies.   Review of Systems Review of Systems As per HPI  Physical Exam Triage Vital Signs ED Triage Vitals  Enc Vitals Group     BP 10/11/22 1703 120/82     Pulse Rate 10/11/22 1703 80     Resp 10/11/22 1703 18     Temp 10/11/22 1703 98.2 F (36.8 C)     Temp Source 10/11/22 1703 Oral     SpO2 10/11/22 1703 100 %     Weight 10/11/22 1704 277 lb 12.5 oz (126 kg)     Height 10/11/22 1704 6' (1.829 m)     Head Circumference --      Peak Flow --      Pain Score 10/11/22 1704 0     Pain Loc --      Pain Edu? --      Excl. in GC? --    No data found.  Updated Vital Signs BP 120/82 (BP Location: Right Arm)   Pulse 80   Temp 98.2 F (36.8 C) (Oral)   Resp 18   Ht 6' (1.829 m)   Wt 277 lb 12.5 oz (126 kg)   SpO2 100%   BMI 37.67 kg/m    Physical Exam Vitals and nursing note reviewed.  Constitutional:       General: He is not in acute distress.    Appearance: Normal appearance.  HENT:     Mouth/Throat:     Pharynx: Oropharynx is clear.  Cardiovascular:     Rate and Rhythm: Normal rate and regular rhythm.     Pulses: Normal pulses.     Heart sounds: Normal heart sounds.  Pulmonary:     Effort: Pulmonary effort is normal.     Breath sounds: Normal breath sounds.  Neurological:     Mental Status: He is alert and oriented to person, place, and time.      UC Treatments / Results  Labs (all labs ordered are listed, but only abnormal results are displayed) Labs Reviewed  CYTOLOGY, (ORAL, ANAL, URETHRAL) ANCILLARY ONLY    EKG   Radiology No results found.  Procedures Procedures (including critical care time)  Medications Ordered in UC Medications - No data to display  Initial Impression / Assessment and Plan / UC Course  I have reviewed the triage vital signs and the nursing notes.  Pertinent labs & imaging results that were available during my care of the patient were reviewed by me and considered in my medical decision making (see chart for details).  Cytology swab is pending Treat positive result if indicated No questions at this time  Final Clinical Impressions(s) / UC Diagnoses   Final diagnoses:  Screen for STD (sexually transmitted disease)     Discharge Instructions      We will call you if anything on your swab returns positive. Please abstain from sexual intercourse until your results return.    ED Prescriptions   None    PDMP not reviewed this encounter.   Kathrine Haddock 10/11/22 1731

## 2022-10-12 LAB — CYTOLOGY, (ORAL, ANAL, URETHRAL) ANCILLARY ONLY
Chlamydia: POSITIVE — AB
Comment: NEGATIVE
Comment: NEGATIVE
Comment: NORMAL
Neisseria Gonorrhea: NEGATIVE
Trichomonas: POSITIVE — AB

## 2022-10-13 ENCOUNTER — Telehealth (HOSPITAL_COMMUNITY): Payer: Self-pay | Admitting: Emergency Medicine

## 2022-10-13 MED ORDER — DOXYCYCLINE HYCLATE 100 MG PO CAPS: 100.0000 mg | ORAL_CAPSULE | Freq: Two times a day (BID) | ORAL | 0 refills | Status: DC

## 2022-10-13 MED ORDER — METRONIDAZOLE 500 MG PO TABS: 2000.0000 mg | ORAL_TABLET | Freq: Once | ORAL | 0 refills | Status: AC

## 2022-10-15 ENCOUNTER — Telehealth (HOSPITAL_COMMUNITY): Payer: Self-pay | Admitting: Emergency Medicine

## 2022-10-15 MED ORDER — DOXYCYCLINE HYCLATE 100 MG PO CAPS: 100.0000 mg | ORAL_CAPSULE | Freq: Two times a day (BID) | ORAL | 0 refills | Status: AC

## 2022-10-15 MED ORDER — METRONIDAZOLE 500 MG PO TABS: 2000.0000 mg | ORAL_TABLET | Freq: Once | ORAL | 0 refills | Status: AC

## 2022-10-21 ENCOUNTER — Telehealth (HOSPITAL_COMMUNITY): Payer: Self-pay

## 2022-10-21 NOTE — Telephone Encounter (Signed)
Patient returning phone call to the nurse in regards to his lab results. Patient informed of positive Trich and Chlamydia.   Patient verbalized understanding and states he has picked up both antibiotics from the pharmacy. Patient informed and verbalized understanding to take the medications until they are gone/completed.

## 2022-10-23 ENCOUNTER — Ambulatory Visit (HOSPITAL_COMMUNITY): Payer: 59

## 2022-11-02 ENCOUNTER — Encounter (HOSPITAL_COMMUNITY): Payer: Self-pay

## 2022-11-02 ENCOUNTER — Ambulatory Visit (HOSPITAL_COMMUNITY)
Admission: RE | Admit: 2022-11-02 | Discharge: 2022-11-02 | Disposition: A | Discharge status: Home / Self Care | Payer: 59 | Source: Ambulatory Visit | Attending: Emergency Medicine | Admitting: Emergency Medicine

## 2022-11-02 VITALS — BP 133/86 | HR 67 | Temp 99.1°F | Resp 18

## 2022-11-02 DIAGNOSIS — Z8619 Personal history of other infectious and parasitic diseases: Secondary | ICD-10-CM | POA: Diagnosis present

## 2022-11-02 DIAGNOSIS — R369 Urethral discharge, unspecified: Secondary | ICD-10-CM | POA: Diagnosis present

## 2022-11-02 DIAGNOSIS — Z113 Encounter for screening for infections with a predominantly sexual mode of transmission: Secondary | ICD-10-CM | POA: Diagnosis present

## 2022-11-02 MED ORDER — DOXYCYCLINE HYCLATE 100 MG PO CAPS: 100.0000 mg | ORAL_CAPSULE | Freq: Two times a day (BID) | ORAL | 0 refills | Status: DC

## 2022-11-02 NOTE — Discharge Instructions (Signed)
Finish the doxycycline, even if you feel better your symptoms resolved.  We will base further treatment off of labs.  Give [place] a working phone number so that we can contact you if needed. Refrain from sexual contact until all of your labs have come back, symptoms have resolved, and your partner(s) are treated if necessary. Return to the ER if you get worse, have a fever >100.4, or for any concerns.   Go to www.goodrx.com  or www.costplusdrugs.com to look up your medications. This will give you a list of where you can find your prescriptions at the most affordable prices. Or ask the pharmacist what the cash price is, or if they have any other discount programs available to help make your medication more affordable. This can be less expensive than what you would pay with insurance.

## 2022-11-02 NOTE — ED Provider Notes (Signed)
HPI  SUBJECTIVE:  Raymond Reilly is a 27 y.o. male who presents with returned milky penile discharge.  Patient states that he was recently treated for chlamydia and trichomonas.  States that he finished medication for both of them, but had intercourse with his male partner on day #4, prior to finishing the doxycycline.  He states that his discharge returned shortly thereafter.  He states that he told his partner to be treated 2 to 3 days ago.  Unsure if she has been treated yet or not.  He denies having any other sexual partners.  He has no urinary complaints, penile rash, testicular pain or swelling, abdominal, back, pelvic pain.  He tried Doxy with improvement in symptoms.  No aggravating or alleviating factors.  He has a past medical history of gonorrhea, chlamydia, trichomonas.  No history of other STDs, diabetes.  PCP: None.   History reviewed. No pertinent past medical history.  History reviewed. No pertinent surgical history.  History reviewed. No pertinent family history.  Social History   Tobacco Use   Smoking status: Every Day    Packs/day: .25    Types: Cigarettes   Smokeless tobacco: Never  Vaping Use   Vaping Use: Never used  Substance Use Topics   Alcohol use: Yes   Drug use: Never    No current facility-administered medications for this encounter.  Current Outpatient Medications:    doxycycline (VIBRAMYCIN) 100 MG capsule, Take 1 capsule (100 mg total) by mouth 2 (two) times daily for 7 days., Disp: 14 capsule, Rfl: 0  No Known Allergies   ROS  As noted in HPI.   Physical Exam  BP 133/86 (BP Location: Right Arm)   Pulse 67   Temp 99.1 F (37.3 C) (Oral)   Resp 18   SpO2 99%   Constitutional: Well developed, well nourished, no acute distress Eyes:  EOMI, conjunctiva normal bilaterally HENT: Normocephalic, atraumatic,mucus membranes moist Respiratory: Normal inspiratory effort Cardiovascular: Normal rate GI: nondistended GU: Declined exam skin:  No rash, skin intact Musculoskeletal: no deformities  Neurologic: Alert & oriented x 3, no focal neuro deficits Psychiatric: Speech and behavior appropriate   ED Course   Medications - No data to display  Orders Placed This Encounter  Procedures   Nursing Communication Please set up with PCP prior to discharge    Please set up with PCP prior to discharge    Standing Status:   Standing    Number of Occurrences:   1    No results found for this or any previous visit (from the past 24 hour(s)). No results found.  ED Clinical Impression  1. History of chlamydia   2. Screening for STDs (sexually transmitted diseases)   3. Penile discharge      ED Assessment/Plan     Sending off swab for gonorrhea, chlamydia, trichomonas.  Patient declined HIV, RPR.  Will send home on a second week of doxycycline.  Will base further treatment off labs.  Advised patient to refrain from intercourse until symptoms resolve, partner/partners have been treated, and all of his labs have been resulted.  Will have staff patient with PCP prior to discharge.  Discussed labs, MDM, treatment plan, and plan for follow-up with patient. patient agrees with plan.   Meds ordered this encounter  Medications   doxycycline (VIBRAMYCIN) 100 MG capsule    Sig: Take 1 capsule (100 mg total) by mouth 2 (two) times daily for 7 days.    Dispense:  14 capsule  Refill:  0      *This clinic note was created using Scientist, clinical (histocompatibility and immunogenetics). Therefore, there may be occasional mistakes despite careful proofreading.  ?    Domenick Gong, MD 11/02/22 [phone]

## 2022-11-02 NOTE — ED Triage Notes (Signed)
Pt states last time he was here he was positive for chlamydia he finished his meds but he had sex on day 4 he didn't wait until day 7. He states he advised his partner but he isnt sure if she was ever tested or treated before they had sex. He is having milky discharge still.

## 2022-11-03 LAB — CYTOLOGY, (ORAL, ANAL, URETHRAL) ANCILLARY ONLY
Chlamydia: NEGATIVE
Comment: NEGATIVE
Comment: NEGATIVE
Comment: NORMAL
Neisseria Gonorrhea: NEGATIVE
Trichomonas: POSITIVE — AB

## 2022-11-04 ENCOUNTER — Telehealth (HOSPITAL_COMMUNITY): Payer: Self-pay | Admitting: Emergency Medicine

## 2022-11-04 MED ORDER — METRONIDAZOLE 500 MG PO TABS: 2000.0000 mg | ORAL_TABLET | Freq: Once | ORAL | 0 refills | Status: AC

## 2022-11-08 ENCOUNTER — Other Ambulatory Visit: Payer: Self-pay

## 2022-11-08 ENCOUNTER — Encounter (HOSPITAL_COMMUNITY): Payer: Self-pay | Admitting: *Deleted

## 2022-11-08 ENCOUNTER — Ambulatory Visit (HOSPITAL_COMMUNITY)
Admission: EM | Admit: 2022-11-08 | Discharge: 2022-11-08 | Disposition: A | Discharge status: Home / Self Care | Payer: 59 | Attending: Family Medicine | Admitting: Family Medicine

## 2022-11-08 DIAGNOSIS — Z202 Contact with and (suspected) exposure to infections with a predominantly sexual mode of transmission: Secondary | ICD-10-CM | POA: Insufficient documentation

## 2022-11-08 DIAGNOSIS — R197 Diarrhea, unspecified: Secondary | ICD-10-CM | POA: Diagnosis present

## 2022-11-08 NOTE — ED Triage Notes (Signed)
Pt has been taking Doxycycline on a empty stomach. Pt reports ABD pain and diarrhea. Pt is also worried because he still has discharge from Penis.

## 2022-11-08 NOTE — Discharge Instructions (Signed)
Staff will notify you if there is anything positive on the swab  Please stop taking doxycycline.

## 2022-11-08 NOTE — ED Provider Notes (Signed)
[facility]    CSN: [id number] Arrival date & time: 11/08/22  0825      History   Chief Complaint Chief Complaint  Patient presents with   Diarrhea   Abdominal Pain   Penile Discharge    HPI Raymond Reilly is a 27 y.o. male.    Diarrhea Associated symptoms: abdominal pain   Abdominal Pain Associated symptoms: diarrhea   Penile Discharge Associated symptoms include abdominal pain.   Here for abdominal pain and diarrhea.  He also has had a little bit of penile discharge still.  On May 27 he tested positive for chlamydia.  He took doxycycline as prescribed then and it did cause some abdominal pain and diarrhea but he finished the course of it by the end of the first week of June.  He was concerned that he had possibly been reexposed.  He was seen here June 18 and cytoswab was negative for chlamydia and positive for trichomonas.  At the time of the visit, he was represcribed doxycycline as it was unclear that he had finished his course of antibiotics at that time.  He is on about day 6 of doxycycline and reports he is having abdominal pain and diarrhea.  He has not taken it today.  No vomiting.  He did take the 4 tablets of metronidazole for the trichomonas on June 20.  He states he is having some penile discharge only when he puts pressure on his penis.  No dysuria.  He wishes to retest  History reviewed. No pertinent past medical history.  There are no problems to display for this patient.   History reviewed. No pertinent surgical history.     Home Medications    Prior to Admission medications   Not on File    Family History History reviewed. No pertinent family history.  Social History Social History   Tobacco Use   Smoking status: Every Day    Packs/day: .25    Types: Cigarettes   Smokeless tobacco: Never  Vaping Use   Vaping Use: Never used  Substance Use Topics   Alcohol use: Yes   Drug use: Never     Allergies   Patient has no  known allergies.   Review of Systems Review of Systems  Gastrointestinal:  Positive for abdominal pain and diarrhea.  Genitourinary:  Positive for penile discharge.     Physical Exam Triage Vital Signs ED Triage Vitals  Enc Vitals Group     BP 11/08/22 0848 134/87     Pulse Rate 11/08/22 0848 75     Resp 11/08/22 0848 18     Temp 11/08/22 0848 98.2 F (36.8 C)     Temp src --      SpO2 11/08/22 0848 96 %     Weight --      Height --      Head Circumference --      Peak Flow --      Pain Score 11/08/22 0847 2     Pain Loc --      Pain Edu? --      Excl. in GC? --    No data found.  Updated Vital Signs BP 134/87   Pulse 75   Temp 98.2 F (36.8 C)   Resp 18   SpO2 96%   Visual Acuity Right Eye Distance:   Left Eye Distance:   Bilateral Distance:    Right Eye Near:   Left Eye Near:    Bilateral Near:  Physical Exam Vitals reviewed.  Constitutional:      General: He is not in acute distress.    Appearance: He is not ill-appearing, toxic-appearing or diaphoretic.  Cardiovascular:     Rate and Rhythm: Normal rate and regular rhythm.     Heart sounds: No murmur heard. Pulmonary:     Effort: Pulmonary effort is normal.     Breath sounds: Normal breath sounds.  Abdominal:     Palpations: Abdomen is soft.     Tenderness: There is no abdominal tenderness.  Skin:    Coloration: Skin is not pale.  Neurological:     Mental Status: He is alert and oriented to person, place, and time.  Psychiatric:        Behavior: Behavior normal.      UC Treatments / Results  Labs (all labs ordered are listed, but only abnormal results are displayed) Labs Reviewed  CYTOLOGY, (ORAL, ANAL, URETHRAL) ANCILLARY ONLY    EKG   Radiology No results found.  Procedures Procedures (including critical care time)  Medications Ordered in UC Medications - No data to display  Initial Impression / Assessment and Plan / UC Course  I have reviewed the triage vital signs  and the nursing notes.  Pertinent labs & imaging results that were available during my care of the patient were reviewed by me and considered in my medical decision making (see chart for details).     I discussed with him that his chlamydia was negative and that he should stop the doxycycline.  Though it is probably too soon to retest, he is very concerned that he needs a new swab done.  Sital swab was done again today and we will notify him of any positives and treat per protocol. Final Clinical Impressions(s) / UC Diagnoses   Final diagnoses:  Diarrhea, unspecified type  Exposure to STD     Discharge Instructions      Staff will notify you if there is anything positive on the swab  Please stop taking doxycycline.     ED Prescriptions   None    PDMP not reviewed this encounter.   Zenia Resides, MD 11/08/22 [phone]

## 2022-11-09 LAB — CYTOLOGY, (ORAL, ANAL, URETHRAL) ANCILLARY ONLY
Chlamydia: NEGATIVE
Comment: NEGATIVE
Comment: NEGATIVE
Comment: NORMAL
Neisseria Gonorrhea: NEGATIVE
Trichomonas: POSITIVE — AB

## 2022-11-10 ENCOUNTER — Telehealth (HOSPITAL_COMMUNITY): Payer: Self-pay | Admitting: Emergency Medicine

## 2022-11-10 MED ORDER — METRONIDAZOLE 500 MG PO TABS: 2000.0000 mg | ORAL_TABLET | Freq: Once | ORAL | 0 refills | Status: AC

## 2023-01-12 ENCOUNTER — Ambulatory Visit: Payer: 59 | Admitting: Family Medicine

## 2023-09-27 ENCOUNTER — Ambulatory Visit (HOSPITAL_COMMUNITY)
Admission: EM | Admit: 2023-09-27 | Discharge: 2023-09-27 | Disposition: A | Discharge status: Home / Self Care | Attending: Family Medicine | Admitting: Family Medicine

## 2023-09-27 ENCOUNTER — Other Ambulatory Visit: Payer: Self-pay

## 2023-09-27 ENCOUNTER — Encounter (HOSPITAL_COMMUNITY): Payer: Self-pay | Admitting: *Deleted

## 2023-09-27 DIAGNOSIS — R369 Urethral discharge, unspecified: Secondary | ICD-10-CM

## 2023-09-27 MED ORDER — CEFTRIAXONE SODIUM 500 MG IJ SOLR
INTRAMUSCULAR | Status: AC
  Filled 2023-09-27: qty 500

## 2023-09-27 MED ORDER — DOXYCYCLINE HYCLATE 100 MG PO CAPS: 100.0000 mg | ORAL_CAPSULE | Freq: Two times a day (BID) | ORAL | 0 refills | Status: AC

## 2023-09-27 MED ORDER — STERILE WATER FOR INJECTION IJ SOLN
INTRAMUSCULAR | Status: AC
  Filled 2023-09-27: qty 10

## 2023-09-27 MED ORDER — CEFTRIAXONE SODIUM 500 MG IJ SOLR
500.0000 mg | Freq: Once | INTRAMUSCULAR | Status: AC
  Administered 2023-09-27: 500 mg via INTRAMUSCULAR

## 2023-09-27 NOTE — Discharge Instructions (Signed)

## 2023-09-27 NOTE — ED Provider Notes (Signed)
  [facility]   [id number] 09/27/23 Arrival Time: 1017  ASSESSMENT & PLAN:  1. Penile discharge      Discharge Instructions      You have been given the following today for treatment of suspected gonorrhea and/or chlamydia:  cefTRIAXone  (ROCEPHIN ) injection 500 mg  Please pick up your prescription for doxycycline  100 mg and begin taking twice daily for the next seven (7) days.  Even though we have treated you today, we have sent testing for sexually transmitted infections. We will notify you of any positive results once they are received. If required, we will prescribe any medications you might need.  Please refrain from all sexual activity for at least the next seven days.    Pending: Labs Reviewed  CYTOLOGY, (ORAL, ANAL, URETHRAL) ANCILLARY ONLY    Will notify of any positive results. Instructed to refrain from sexual activity for at least seven days.  Reviewed expectations re: course of current medical issues. Questions answered. Outlined signs and symptoms indicating need for more acute intervention. Patient verbalized understanding. After Visit Summary given.   SUBJECTIVE:  Raymond Reilly is a 28 y.o. male who presents with complaint of penile discharge. Few days. New sexual partner. Otherwise well.  OBJECTIVE:  Vitals:   09/27/23 1038  BP: 108/73  Pulse: 86  Resp: 20  Temp: 98.6 F (37 C)  SpO2: 98%     General appearance: alert, cooperative, appears stated age and no distress Abdomen: soft, non-tender GU: deferred Skin: warm and dry Psychological: alert and cooperative; normal mood and affect.    Labs Reviewed  CYTOLOGY, (ORAL, ANAL, URETHRAL) ANCILLARY ONLY    No Known Allergies  History reviewed. No pertinent past medical history. History reviewed. No pertinent family history. Social History   Socioeconomic History   Marital status: Single    Spouse name: Not on file   Number of children: Not on file   Years of  education: Not on file   Highest education level: Not on file  Occupational History   Not on file  Tobacco Use   Smoking status: Every Day    Current packs/day: 0.25    Types: Cigarettes   Smokeless tobacco: Never  Vaping Use   Vaping status: Never Used  Substance and Sexual Activity   Alcohol use: Yes   Drug use: Never   Sexual activity: Yes    Birth control/protection: None  Other Topics Concern   Not on file  Social History Narrative   Not on file   Social Drivers of Health   Financial Resource Strain: Not on file  Food Insecurity: Not on file  Transportation Needs: Not on file  Physical Activity: Not on file  Stress: Not on file  Social Connections: Not on file  Intimate Partner Violence: Not on file           Afton Albright, MD 09/27/23 1203

## 2023-09-27 NOTE — ED Triage Notes (Signed)
 PT reports he has a drainage from penis and itching to penis.

## 2023-09-28 LAB — CYTOLOGY, (ORAL, ANAL, URETHRAL) ANCILLARY ONLY
Chlamydia: NEGATIVE
Comment: NEGATIVE
Comment: NEGATIVE
Comment: NORMAL
Neisseria Gonorrhea: NEGATIVE
Trichomonas: NEGATIVE
